# Patient Record
Sex: Female | Born: 1945 | Race: White | Hispanic: No | Marital: Single
Health system: Southern US, Community
[De-identification: ages and names within clinical notes are randomized; demographics above are authoritative.]

## PROBLEM LIST (undated history)

## (undated) DIAGNOSIS — C679 Malignant neoplasm of bladder, unspecified: Secondary | ICD-10-CM

## (undated) DIAGNOSIS — J449 Chronic obstructive pulmonary disease, unspecified: Secondary | ICD-10-CM

## (undated) DIAGNOSIS — I10 Essential (primary) hypertension: Secondary | ICD-10-CM

## (undated) DIAGNOSIS — C50919 Malignant neoplasm of unspecified site of unspecified female breast: Secondary | ICD-10-CM

---

## 2018-08-04 ENCOUNTER — Emergency Department
Admission: EM | Admit: 2018-08-04 | Discharge: 2018-08-05 | Disposition: A | Discharge status: Other Acute Inpatient Hospital | Payer: Medicare Other | Attending: Emergency Medicine | Admitting: Emergency Medicine

## 2018-08-04 ENCOUNTER — Encounter: Payer: Self-pay | Admitting: Emergency Medicine

## 2018-08-04 ENCOUNTER — Emergency Department: Payer: Medicare Other

## 2018-08-04 DIAGNOSIS — R112 Nausea with vomiting, unspecified: Secondary | ICD-10-CM

## 2018-08-04 DIAGNOSIS — I10 Essential (primary) hypertension: Secondary | ICD-10-CM | POA: Diagnosis not present

## 2018-08-04 DIAGNOSIS — J449 Chronic obstructive pulmonary disease, unspecified: Secondary | ICD-10-CM | POA: Insufficient documentation

## 2018-08-04 DIAGNOSIS — R1030 Lower abdominal pain, unspecified: Secondary | ICD-10-CM

## 2018-08-04 DIAGNOSIS — Z8551 Personal history of malignant neoplasm of bladder: Secondary | ICD-10-CM | POA: Insufficient documentation

## 2018-08-04 DIAGNOSIS — Z853 Personal history of malignant neoplasm of breast: Secondary | ICD-10-CM | POA: Insufficient documentation

## 2018-08-04 DIAGNOSIS — K56609 Unspecified intestinal obstruction, unspecified as to partial versus complete obstruction: Secondary | ICD-10-CM | POA: Diagnosis not present

## 2018-08-04 HISTORY — DX: Essential (primary) hypertension: I10

## 2018-08-04 HISTORY — DX: Chronic obstructive pulmonary disease, unspecified: J44.9

## 2018-08-04 HISTORY — DX: Malignant neoplasm of unspecified site of unspecified female breast: C50.919

## 2018-08-04 HISTORY — DX: Malignant neoplasm of bladder, unspecified: C67.9

## 2018-08-04 LAB — CBC
HCT: 42 % (ref 36.0–46.0)
Hemoglobin: 13.5 g/dL (ref 12.0–15.0)
MCH: 30.1 pg (ref 26.0–34.0)
MCHC: 32.1 g/dL (ref 30.0–36.0)
MCV: 93.8 fL (ref 80.0–100.0)
PLATELETS: 107 10*3/uL — AB (ref 150–400)
RBC: 4.48 MIL/uL (ref 3.87–5.11)
RDW: 13.8 % (ref 11.5–15.5)
WBC: 13.9 10*3/uL — ABNORMAL HIGH (ref 4.0–10.5)
nRBC: 0 % (ref 0.0–0.2)

## 2018-08-04 LAB — COMPREHENSIVE METABOLIC PANEL
ALBUMIN: 4.1 g/dL (ref 3.5–5.0)
ALK PHOS: 47 U/L (ref 38–126)
ALT: 18 U/L (ref 0–44)
ANION GAP: 10 (ref 5–15)
AST: 19 U/L (ref 15–41)
BILIRUBIN TOTAL: 1.6 mg/dL — AB (ref 0.3–1.2)
BUN: 25 mg/dL — ABNORMAL HIGH (ref 8–23)
CALCIUM: 9.2 mg/dL (ref 8.9–10.3)
CO2: 43 mmol/L — AB (ref 22–32)
Chloride: 87 mmol/L — ABNORMAL LOW (ref 98–111)
Creatinine, Ser: 0.71 mg/dL (ref 0.44–1.00)
GFR calc Af Amer: >60 mL/min (ref >60)
GFR calc non Af Amer: >60 mL/min (ref >60)
Glucose, Bld: 140 mg/dL — ABNORMAL HIGH (ref 70–99)
Potassium: 3.6 mmol/L (ref 3.5–5.1)
SODIUM: 140 mmol/L (ref 135–145)
TOTAL PROTEIN: 6 g/dL — AB (ref 6.5–8.1)

## 2018-08-04 LAB — TROPONIN I: Troponin I: 0.04 ng/mL (ref <0.03)

## 2018-08-04 LAB — LIPASE, BLOOD: Lipase: 25 U/L (ref 11–51)

## 2018-08-04 MED ORDER — ONDANSETRON HCL 4 MG/2ML IJ SOLN
INTRAMUSCULAR | Status: AC
  Administered 2018-08-04: 4 mg via INTRAVENOUS
  Filled 2018-08-04: qty 2

## 2018-08-04 MED ORDER — ONDANSETRON HCL 4 MG/2ML IJ SOLN
4.0000 mg | Freq: Once | INTRAMUSCULAR | Status: AC | PRN
  Administered 2018-08-04: 4 mg via INTRAVENOUS

## 2018-08-04 MED ORDER — SODIUM CHLORIDE 0.9 % IV BOLUS
1000.0000 mL | Freq: Once | INTRAVENOUS | Status: AC
  Administered 2018-08-04: 1000 mL via INTRAVENOUS

## 2018-08-04 MED ORDER — IOPAMIDOL (ISOVUE-300) INJECTION 61%
100.0000 mL | Freq: Once | INTRAVENOUS | Status: AC | PRN
  Administered 2018-08-04: 100 mL via INTRAVENOUS

## 2018-08-04 NOTE — ED Triage Notes (Signed)
Pt arrived from home via EMS with c/o lower abdominal pain, Nausea and abdominal distention x3 days. Pt has COPD, on 3L chronically.

## 2018-08-05 ENCOUNTER — Emergency Department: Payer: Medicare Other

## 2018-08-05 LAB — URINALYSIS, COMPLETE (UACMP) WITH MICROSCOPIC
BACTERIA UA: NONE SEEN
Bilirubin Urine: NEGATIVE
Glucose, UA: NEGATIVE mg/dL
Hgb urine dipstick: NEGATIVE
KETONES UR: 20 mg/dL — AB
Leukocytes, UA: NEGATIVE
Nitrite: NEGATIVE
PH: 6 (ref 5.0–8.0)
PROTEIN: 30 mg/dL — AB
Specific Gravity, Urine: 1.019 (ref 1.005–1.030)

## 2018-08-05 NOTE — ED Provider Notes (Signed)
-----------------------------------------   1:18 AM on 08/05/2018 -----------------------------------------  CT abdomen/pelvis interpreted per Dr. Francoise Ceo:  1. Dilated fluid-filled stomach and proximal small bowel, consistent  with small bowel obstruction. Transition point visualized within the  central pelvis where there is caliber change to decompressed mid to  distal small bowel, findings likely due to adhesions.  2. Status post cystectomy with creation of neobladder/[place] pouch  in the right hemiabdomen. No significant hydronephrosis. Both  ureters are slightly dilated and demonstrate mild urothelial  enhancement; there is no evidence for ureteral stone.  3. 15 mm left adrenal gland nodule, may be further evaluated with  nonemergent dedicated adrenal CT  4. Emphysema   UA noted; will add culture.  Updated patient of CT scan results.  She tells me she gets the majority of her care at [facility] and prefers to be transferred for her small bowel obstruction.  For some reason, I am unable to see her [facility] records.  Will call [facility] transfer center per patient's request.  Surgical history includes cystectomy with [place] pouch, appendectomy, double mastectomy.   ----------------------------------------- 1:30 AM on 08/05/2018 -----------------------------------------  Spoke with [facility] transfer center who will call back with general surgery service.   ----------------------------------------- 2:06 AM on 08/05/2018 -----------------------------------------  Patient accepted by Dr. Aletha Halim to the [facility] ED.  Patient updated; transport called.  Of note, NG tube was placed with return of greater than 1 L gastric contents.   ----------------------------------------- 2:29 AM on 08/05/2018 -----------------------------------------  EMS at bedside to transport patient to [facility].   Paulette Blanch, MD 08/05/18 [phone]

## 2018-08-05 NOTE — ED Provider Notes (Signed)
[facility] Emergency Department Provider Note  ____________________________________________  Time seen: Approximately 12:06 AM  I have reviewed the triage vital signs and the nursing notes.   HISTORY  Chief Complaint Abdominal Pain and Nausea    HPI Jenna Church is a 72 y.o. female with a history of bladder cancer status post cystectomy, breast cancer status post double mastectomy, COPD and hypertension who complains of lower abdominal pain for the past 3 days associate with nausea and vomiting today, abdominal distention for the past 3 days.  No bowel movements in last 2 days.  Denies fever chills sweats.  No chest pain shortness of breath or exertional symptoms.  No dizziness or syncope.  She is been unable to eat or drink anything for the last few days.  She feels very weak.  Abdominal pain is 5/10, nonradiating, no aggravating alleviating factors, waxing and waning.  She has a ostomy for urine outflow which she intermittently caths throughout the day.      Past Medical History:  Diagnosis Date  . Bladder cancer ([place])   . Breast cancer ([place])   . COPD (chronic obstructive pulmonary disease) ([place])   . Hypertension      There are no active problems to display for this patient.    History reviewed. No pertinent surgical history.   Prior to Admission medications   Not on File     Allergies Hydrocodone and Sulfa antibiotics   No family history on file.  Social History Social History   Tobacco Use  . Smoking status: Not on file  Substance Use Topics  . Alcohol use: Not on file  . Drug use: Not on file    Review of Systems  Constitutional:   No fever or chills.  ENT:   No sore throat. No rhinorrhea. Cardiovascular:   No chest pain or syncope. Respiratory:   No dyspnea or cough. Gastrointestinal: Positive as above for abdominal pain and vomiting.  No diarrhea.  Positive constipation..  Musculoskeletal:   Negative for focal pain or  swelling All other systems reviewed and are negative except as documented above in ROS and HPI.  ____________________________________________   PHYSICAL EXAM:  VITAL SIGNS: ED Triage Vitals  Enc Vitals Group     BP 08/04/18 2236 128/65     Pulse Rate 08/04/18 2300 83     Resp 08/04/18 2236 (!) 22     Temp --      Temp src --      SpO2 08/04/18 2300 97 %     Weight 08/04/18 2228 120 lb (54.4 kg)     Height 08/04/18 2228 5\' 6"  (1.676 m)     Head Circumference --      Peak Flow --      Pain Score --      Pain Loc --      Pain Edu? --      Excl. in [place]? --     Vital signs reviewed, nursing assessments reviewed.   Constitutional:   Alert and oriented.  Ill-appearing Eyes:   Conjunctivae are normal. EOMI. PERRL. ENT      Head:   Normocephalic and atraumatic.      Nose:   No congestion/rhinnorhea.       Mouth/Throat:   Dry mucous membranes, no pharyngeal erythema. No peritonsillar mass.       Neck:   No meningismus. Full ROM. Hematological/Lymphatic/Immunilogical:   No cervical lymphadenopathy. Cardiovascular:   RRR. Symmetric bilateral radial and DP pulses.  No murmurs. Cap refill less than 2 seconds. Respiratory:   Normal respiratory effort without tachypnea/retractions. Breath sounds are clear and equal bilaterally. No wheezes/rales/rhonchi. Gastrointestinal:   Soft with mild generalized tenderness.  Moderately distended.. There is no CVA tenderness.  No rebound, rigidity, or guarding.  Ostomy site noninflamed.  Musculoskeletal:   Normal range of motion in all extremities. No joint effusions.  No lower extremity tenderness.  No edema. Neurologic:   Normal speech and language.  Motor grossly intact. No acute focal neurologic deficits are appreciated.  Skin:    Skin is warm, dry and intact. No rash noted.  No petechiae, purpura, or bullae.  ____________________________________________    LABS (pertinent positives/negatives) (all labs ordered are listed, but only abnormal  results are displayed) Labs Reviewed  COMPREHENSIVE METABOLIC PANEL - Abnormal; Notable for the following components:      Result Value   Chloride 87 (*)    CO2 43 (*)    Glucose, Bld 140 (*)    BUN 25 (*)    Total Protein 6.0 (*)    Total Bilirubin 1.6 (*)    All other components within normal limits  CBC - Abnormal; Notable for the following components:   WBC 13.9 (*)    Platelets 107 (*)    All other components within normal limits  TROPONIN I - Abnormal; Notable for the following components:   Troponin I 0.04 (*)    All other components within normal limits  LIPASE, BLOOD  URINALYSIS, COMPLETE (UACMP) WITH MICROSCOPIC   ____________________________________________   EKG  Interpreted by me Sinus rhythm rate of 91, normal axis intervals QRS ST segments and T waves.  1 PVC on the strip.  ____________________________________________    RADIOLOGY  No results found.  ____________________________________________   PROCEDURES Procedures  ____________________________________________  DIFFERENTIAL DIAGNOSIS   Bowel obstruction, perforation, diverticulitis, viral syndrome, constipation, dehydration  CLINICAL IMPRESSION / ASSESSMENT AND PLAN / ED COURSE  Pertinent labs & imaging results that were available during my care of the patient were reviewed by me and considered in my medical decision making (see chart for details).    Patient presents with generalized abdominal pain and vomiting, high suspicion for small bowel obstruction.  Check labs, CT abdomen pelvis.  IV fluids 1 L saline bolus for rehydration.  Zofran for nausea control.  Analgesia but patient declines on initial assessment.    ----------------------------------------- 12:09 AM on 08/05/2018 -----------------------------------------  Labs appear to show prerenal azotemia, contraction alkalosis.  Troponin was sent by nursing protocol.  Results of 0.04 is of unclear significance.  Patient has no chest  pain shortness of breath or anginal equivalent symptoms. EKG was also nonischemic. No further cardiac work-up needed at this time unless she has evolving symptoms.      ____________________________________________   FINAL CLINICAL IMPRESSION(S) / ED DIAGNOSES    Final diagnoses:  Generalized abdominal pain     ED Discharge Orders    None      Portions of this note were generated with dragon dictation software. Dictation errors may occur despite best attempts at proofreading.    Carrie Mew, MD 08/05/18 0010

## 2018-08-06 LAB — URINE CULTURE

## 2018-11-21 DEATH — deceased

## 2019-06-15 IMAGING — CT CT ABD-PELV W/ CM
2 of 5 series · 15 of 46 positions shown, 17 images · IV contrast (APPLIED)
Comparison: None.

CLINICAL DATA: Generalized abdominal pain

EXAM:
CT ABDOMEN AND PELVIS WITH CONTRAST
TECHNIQUE: Multidetector CT imaging of the abdomen and pelvis was performed
using the standard protocol following bolus administration of
intravenous contrast.
CONTRAST:  100mL F4I1OV-0OO IOPAMIDOL (F4I1OV-0OO) INJECTION 61%

[Series 2: axial st · axial · 0.71mm/px · z∈[-503,-98]mm · 12 of 91 slices shown, 14 images]
[im 5/91  soft-tissue]
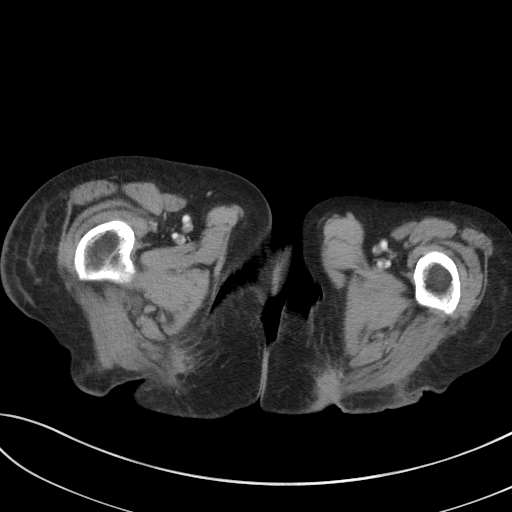
[im 5/91  bone]
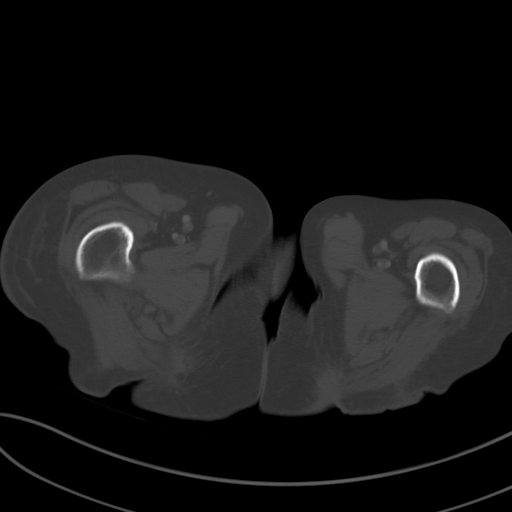
[im 13/91  soft-tissue]
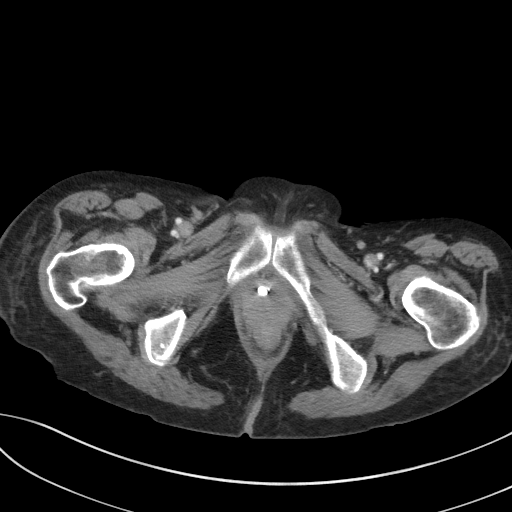
[im 22/91  soft-tissue]
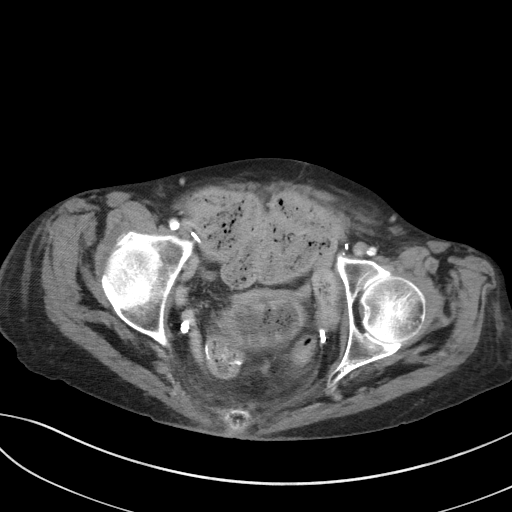
[im 26/91  soft-tissue]
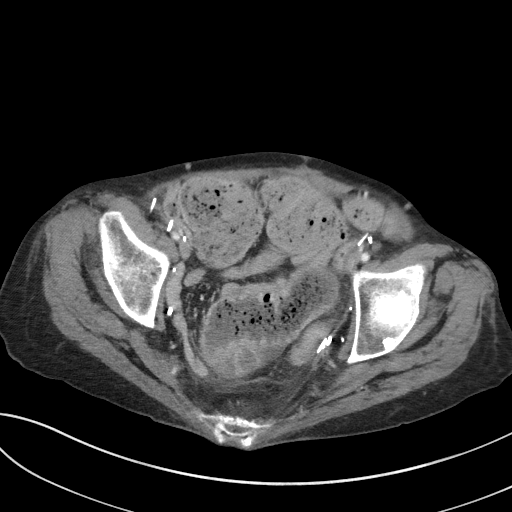
[im 35/91  soft-tissue]
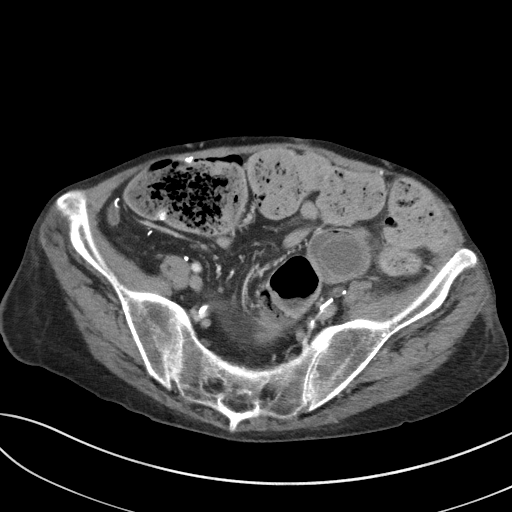
[im 43/91  soft-tissue]
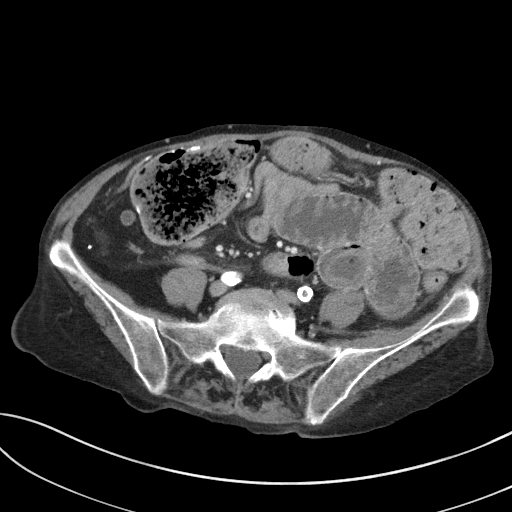
[im 48/91  soft-tissue]
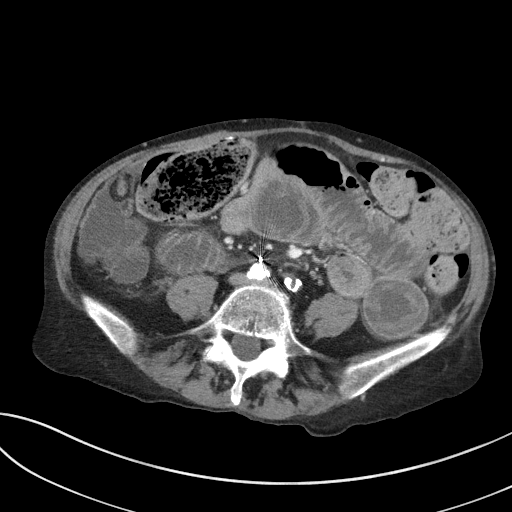
[im 56/91  soft-tissue]
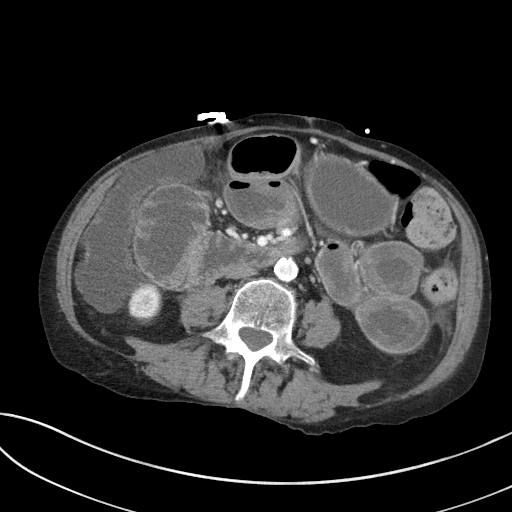
[im 65/91  soft-tissue]
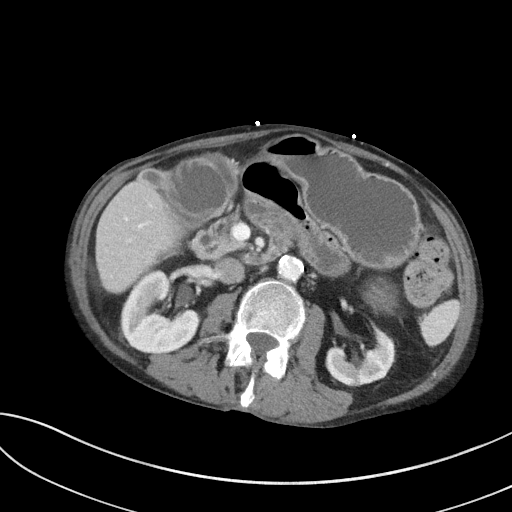
[im 65/91  bone]
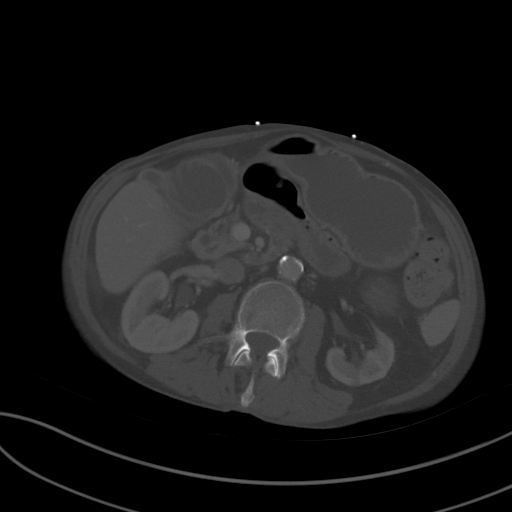
[im 69/91  soft-tissue]
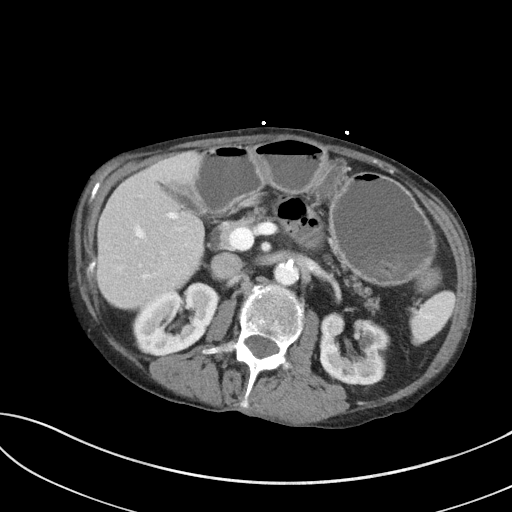
[im 78/91  soft-tissue]
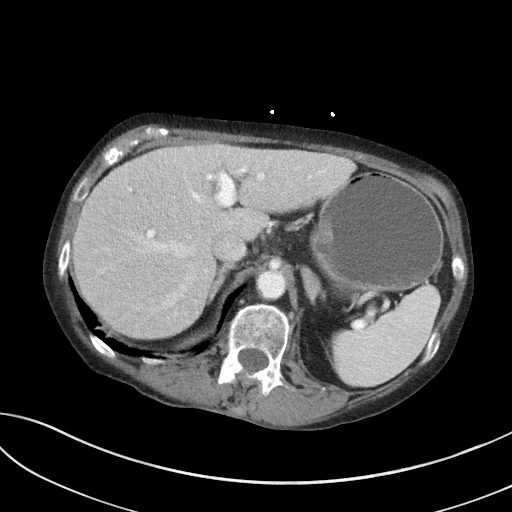
[im 86/91  soft-tissue]
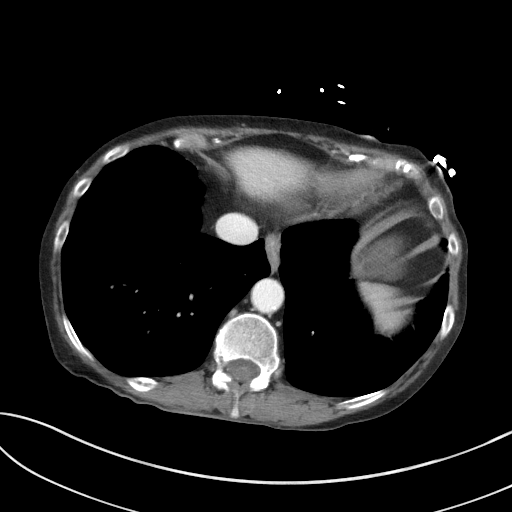

[Series 5: coronal st · coronal · 0.70mm/px · 3 of 76 slices shown]
[im 26/76  soft-tissue]
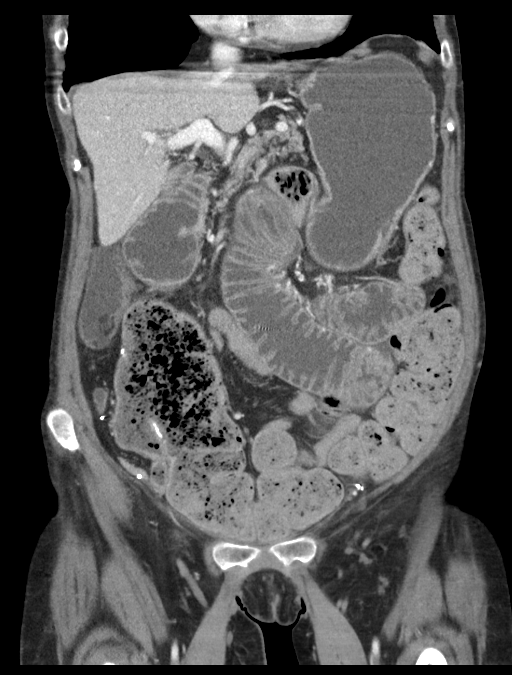
[im 34/76  soft-tissue]
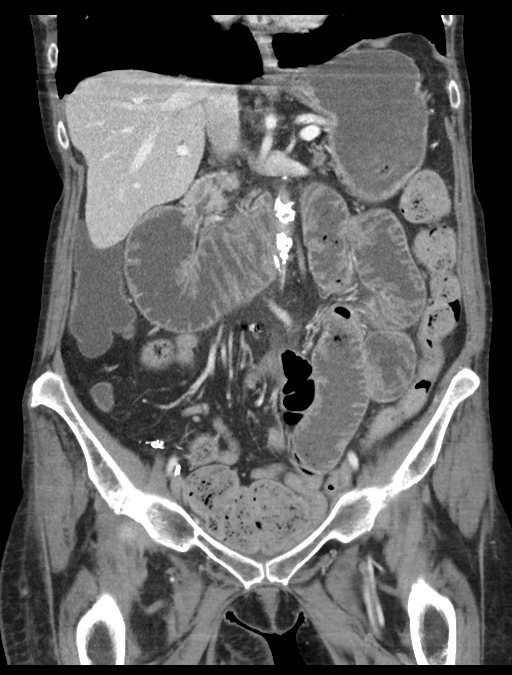
[im 42/76  soft-tissue]
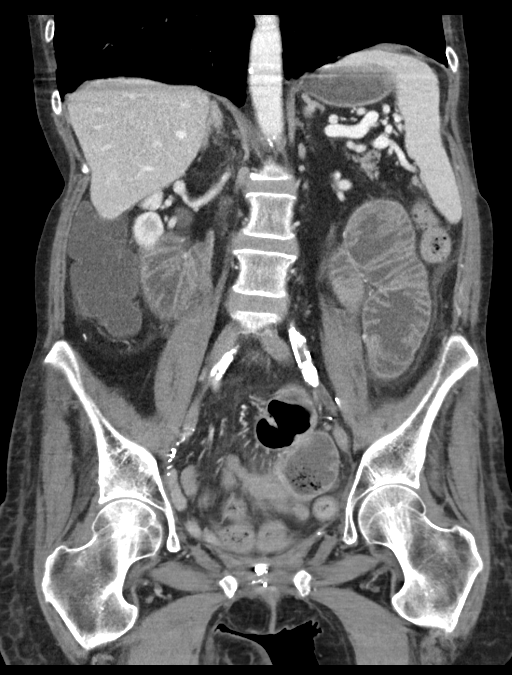

[15 of 46 positions shown; findings below may reference images not displayed]

FINDINGS: Lower chest: Lung bases demonstrate emphysema. No acute
consolidation or pleural effusion. Heart size is normal.

Hepatobiliary: Contracted gallbladder. No focal hepatic abnormality,
calcified stones or biliary dilatation

Pancreas: Unremarkable. No pancreatic ductal dilatation or
surrounding inflammatory changes.

Spleen: Normal in size without focal abnormality.

Adrenals/Urinary Tract: Nodular thickening of right adrenal gland
without discrete mass. 15 mm left adrenal gland nodule. Cortical
scarring within the left kidney. Prominent right greater than left
extrarenal pelvis. Status post cystectomy. Neobladder/[place] pouch
in the right hemiabdomen. Dilated ureters with mild urothelial
enhancement. No stone disease.

Stomach/Bowel: Moderate fluid distention of the stomach. Multiple
dilated loops of proximal small bowel measuring up to 4.2 cm.
Transition point within the central pelvis, series 2, image number
52 with decompressed mid to distal small bowel loops. Right lower
quadrant ileal colic anastomosis with large amount of stool in the
colon. No colon wall thickening. Postsurgical changes at the rectum.

Vascular/Lymphatic: Extensive aortic atherosclerosis without
aneurysm. Heavily calcified common iliac arteries with probable
high-grade stenosis. No significantly enlarged lymph nodes

Reproductive: Status post hysterectomy. No adnexal masses.

Other: No free air or free fluid. Mild presacral soft tissue
stranding and edema. Numerous surgical clips along the pelvic
sidewalls.

Musculoskeletal: No acute osseous abnormality. Significant thinning
of the subcutaneous fat and soft tissues dorsal to the
sacrococcygeal region without underlying bony destruction, soft
tissue gas or fluid collection.
IMPRESSION: 1. Dilated fluid-filled stomach and proximal small bowel, consistent
with small bowel obstruction. Transition point visualized within the
central pelvis where there is caliber change to decompressed mid to
distal small bowel, findings likely due to adhesions.
2. Status post cystectomy with creation of neobladder/[place] pouch
in the right hemiabdomen. No significant hydronephrosis. Both
ureters are slightly dilated and demonstrate mild urothelial
enhancement; there is no evidence for ureteral stone.
3. 15 mm left adrenal gland nodule, may be further evaluated with
nonemergent dedicated adrenal CT
4. Emphysema
